# Patient Record
Sex: Male | Born: 1975 | Race: White | Hispanic: No | Marital: Married | Smoking: Never smoker
Health system: Southern US, Community
[De-identification: ages and names within clinical notes are randomized; demographics above are authoritative.]

## PROBLEM LIST (undated history)

## (undated) DIAGNOSIS — I10 Essential (primary) hypertension: Secondary | ICD-10-CM

---

## 2008-07-26 ENCOUNTER — Ambulatory Visit: Payer: Self-pay | Admitting: General Practice

## 2008-07-26 ENCOUNTER — Emergency Department: Payer: Self-pay | Admitting: Emergency Medicine

## 2010-03-13 ENCOUNTER — Ambulatory Visit: Payer: Self-pay | Admitting: General Practice

## 2010-08-23 ENCOUNTER — Emergency Department: Payer: Self-pay | Admitting: Emergency Medicine

## 2011-11-18 ENCOUNTER — Ambulatory Visit: Payer: Self-pay | Admitting: General Practice

## 2012-05-26 ENCOUNTER — Ambulatory Visit: Payer: Self-pay | Admitting: General Practice

## 2013-09-12 DIAGNOSIS — I10 Essential (primary) hypertension: Secondary | ICD-10-CM | POA: Insufficient documentation

## 2015-03-13 ENCOUNTER — Ambulatory Visit: Payer: Self-pay | Admitting: Registered Nurse

## 2015-03-13 VITALS — BP 130/80 | HR 88 | Temp 98.4°F

## 2015-03-13 DIAGNOSIS — J209 Acute bronchitis, unspecified: Secondary | ICD-10-CM

## 2015-03-13 DIAGNOSIS — J0110 Acute frontal sinusitis, unspecified: Secondary | ICD-10-CM

## 2015-03-13 DIAGNOSIS — H6593 Unspecified nonsuppurative otitis media, bilateral: Secondary | ICD-10-CM

## 2015-03-13 MED ORDER — PREDNISONE 20 MG PO TABS: 40.0000 mg | ORAL_TABLET | Freq: Every day | ORAL | Status: AC

## 2015-03-13 MED ORDER — SALINE SPRAY 0.65 % NA SOLN
2.0000 | NASAL | Status: DC | PRN
Start: 2015-03-13 — End: 2015-12-31

## 2015-03-13 MED ORDER — FLUTICASONE PROPIONATE 50 MCG/ACT NA SUSP: 1.0000 | Freq: Two times a day (BID) | NASAL | Status: DC

## 2015-03-13 MED ORDER — AMOXICILLIN-POT CLAVULANATE 875-125 MG PO TABS: 1.0000 | ORAL_TABLET | Freq: Two times a day (BID) | ORAL | Status: AC

## 2015-03-13 MED ORDER — ACETAMINOPHEN 500 MG PO TABS: 1000.0000 mg | ORAL_TABLET | Freq: Four times a day (QID) | ORAL | Status: DC | PRN

## 2015-03-14 NOTE — Progress Notes (Signed)
Subjective:    Patient ID: Frank Tran, male    DOB: Mar 08, 1975, 40 y.o.   MRN: [id number]  HPI Comments: Married caucasian male here for evaluation of clear sputum productive cough, nasal congestion, rhinitis, sore throat has tried dayquil/nyquil without much relief. daughter sick with sinus infection  History of low cardiac ejection fracture recent heart catheterization didn't show blockage in past year.    Cough This is a new problem. The problem has been waxing and waning. The problem occurs hourly. The cough is productive of sputum. Associated symptoms include ear congestion, nasal congestion, postnasal drip, rhinorrhea and a sore throat. Pertinent negatives include no chest pain, chills, ear pain, eye redness, fever, headaches, heartburn, hemoptysis, myalgias, rash, shortness of breath, sweats, weight loss or wheezing. The symptoms are aggravated by lying down. He has tried OTC cough suppressant for the symptoms. The treatment provided mild relief. There is no history of asthma, bronchiectasis, bronchitis, COPD, emphysema, environmental allergies or pneumonia.      Review of Systems  Constitutional: Negative for fever, chills, weight loss, diaphoresis, activity change, appetite change and fatigue.  HENT: Positive for congestion, postnasal drip, rhinorrhea and sore throat. Negative for dental problem, drooling, ear discharge, ear pain, facial swelling, hearing loss, mouth sores, nosebleeds, sinus pressure, sneezing, tinnitus, trouble swallowing and voice change.   Eyes: Negative for photophobia, pain, discharge, redness, itching and visual disturbance.  Respiratory: Positive for cough. Negative for hemoptysis, choking, chest tightness, shortness of breath, wheezing and stridor.   Cardiovascular: Negative for chest pain, palpitations and leg swelling.  Gastrointestinal: Negative for heartburn, nausea, vomiting, abdominal pain, diarrhea, constipation, blood in stool and abdominal  distention.  Endocrine: Negative for cold intolerance and heat intolerance.  Genitourinary: Negative for difficulty urinating.  Musculoskeletal: Negative for myalgias, back pain, joint swelling, arthralgias, gait problem, neck pain and neck stiffness.  Skin: Negative for rash.  Allergic/Immunologic: Negative for environmental allergies.  Neurological: Negative for dizziness, tremors, syncope, facial asymmetry, weakness and headaches.  Hematological: Negative for adenopathy. Does not bruise/bleed easily.  Psychiatric/Behavioral: Negative for sleep disturbance.       Objective:   Physical Exam  Constitutional: He is oriented to person, place, and time. Vital signs are normal. He appears well-developed and well-nourished. He is active and cooperative.  Non-toxic appearance. He does not have a sickly appearance. He appears ill. No distress.  HENT:  Head: Normocephalic and atraumatic.  Right Ear: Hearing, external ear and ear canal normal. A middle ear effusion is present.  Left Ear: Hearing, external ear and ear canal normal. A middle ear effusion is present.  Nose: Mucosal edema and rhinorrhea present. No nose lacerations, sinus tenderness, nasal deformity, septal deviation or nasal septal hematoma. No epistaxis.  No foreign bodies. Right sinus exhibits maxillary sinus tenderness. Right sinus exhibits no frontal sinus tenderness. Left sinus exhibits maxillary sinus tenderness. Left sinus exhibits no frontal sinus tenderness.  Mouth/Throat: Uvula is midline and mucous membranes are normal. Mucous membranes are not pale, not dry and not cyanotic. He does not have dentures. No oral lesions. No trismus in the jaw. Normal dentition. No dental abscesses, uvula swelling, lacerations or dental caries. Posterior oropharyngeal edema and posterior oropharyngeal erythema present. No oropharyngeal exudate or tonsillar abscesses.  Eyes: Conjunctivae, EOM and lids are normal. Pupils are equal, round, and reactive  to light. Right eye exhibits no chemosis, no discharge, no exudate and no hordeolum. No foreign body present in the right eye. Left eye exhibits no chemosis, no discharge, no  exudate and no hordeolum. No foreign body present in the left eye. Right conjunctiva is not injected. Right conjunctiva has no hemorrhage. Left conjunctiva is not injected. Left conjunctiva has no hemorrhage. No scleral icterus. Right eye exhibits normal extraocular motion and no nystagmus. Left eye exhibits normal extraocular motion and no nystagmus. Right pupil is round and reactive. Left pupil is round and reactive. Pupils are equal.  Neck: Trachea normal and normal range of motion. Neck supple. No tracheal tenderness, no spinous process tenderness and no muscular tenderness present. No rigidity. No tracheal deviation, no edema, no erythema and normal range of motion present. No thyroid mass and no thyromegaly present.  Cardiovascular: Normal rate, regular rhythm, S1 normal, S2 normal, normal heart sounds and intact distal pulses.  PMI is not displaced.  Exam reveals no gallop and no friction rub.   No murmur heard. Pulmonary/Chest: Effort normal and breath sounds normal. No stridor. No respiratory distress. He has no decreased breath sounds. He has no wheezes. He has no rhonchi. He has no rales.  Abdominal: Soft. He exhibits no distension.  Musculoskeletal: Normal range of motion. He exhibits no edema or tenderness.  Lymphadenopathy:       Head (right side): No submental, no submandibular, no tonsillar, no preauricular, no posterior auricular and no occipital adenopathy present.       Head (left side): No submental, no submandibular, no tonsillar, no preauricular, no posterior auricular and no occipital adenopathy present.    He has no cervical adenopathy.       Right cervical: No superficial cervical, no deep cervical and no posterior cervical adenopathy present.      Left cervical: No superficial cervical, no deep cervical and  no posterior cervical adenopathy present.  Neurological: He is alert and oriented to person, place, and time. He displays no atrophy and no tremor. No cranial nerve deficit or sensory deficit. He exhibits normal muscle tone. He displays no seizure activity. Coordination and gait normal. GCS eye subscore is 4. GCS verbal subscore is 5. GCS motor subscore is 6.  Skin: Skin is warm, dry and intact. No abrasion, no bruising, no burn, no ecchymosis, no laceration, no lesion, no petechiae and no rash noted. He is not diaphoretic. No cyanosis or erythema. No pallor. Nails show no clubbing.  Psychiatric: He has a normal mood and affect. His speech is normal and behavior is normal. Judgment and thought content normal. Cognition and memory are normal.  Nursing note and vitals reviewed.         Assessment & Plan:  A-sinusitis frontal acute, bronchitis acute, otitis media with effusion bilaterally P- flonase 1 spray each nostril BID, saline 2 sprays each nostril q2h prn congestion.  Tylenol 1000mg  po QID prn pain/fever.  If no improvement with 48 hours of saline and flonase use start augmentin 875mg  po BID x 10 days.  Rx given.  No evidence of systemic bacterial infection, non toxic and well hydrated.  I do not see where any further testing or imaging is necessary at this time.   I will suggest supportive care, rest, good hygiene and encourage the patient to take adequate fluids.  The patient is to return to clinic or EMERGENCY ROOM if symptoms worsen or change significantly.  Patient verbalized agreement and understanding of treatment plan and had no further questions at this time.   P2:  Hand washing and cover cough  otc cough suppresant of choice po prn.  Prednisone 40mg  po daily with breakfast start in 48  hours if no improvement with flonase x 48 hours.  Bronchitis simple, community acquired, may have started as viral (probably respiratory syncytial, parainfluenza, influenza, or adenovirus), but now evidence  of acute purulent bronchitis with resultant bronchial edema and mucus formation.  Viruses are the most common cause of bronchial inflammation in otherwise healthy adults with acute bronchitis.  The appearance of sputum is not predictive of whether a bacterial infection is present.  Purulent sputum is most often caused by viral infections.  There are a small portion of those caused by non-viral agents being Mycoplamsa pneumonia.  Microscopic examination or C&S of sputum in the healthy adult with acute bronchitis is generally not helpful (usually negative or normal respiratory flora) other considerations being cough from upper respiratory tract infections, sinusitis or allergic syndromes (mild asthma or viral pneumonia).  Differential Diagnosis:  reactive airway disease (asthma, allergic aspergillosis (eosinophilia), chronic bronchitis, respiratory infection (Sinusitis, Common cold, pneumonia), congestive heart failure, reflux esophagitis, bronchogenic tumor, aspiration syndromes and/or exposure irritants/tobacco smoke.  In this case, there is no evidence of any invasive bacterial illness.  Most likely viral etiology so will hold on antibiotic treatment.  Advise supportive care with rest, encourage fluids, good hygiene and watch for any worsening symptoms.  If they were to develop:  come back to the office or go to the emergency room if after hours. Without high fever, severe dyspnea, lack of physical findings or other risk factors, I will hold on a chest radiograph and CBC at this time.  I discussed that approximately 50% of patients with acute bronchitis have a cough that lasts up to three weeks, and 25% for over a month.  Tylenol, one to two tablets every four hours as needed for fever or myalgias.   No aspirin.  Patient instructed to follow up in one week or sooner if symptoms worsen. Patient verbalized agreement and understanding of treatment plan.  P2:  hand washing and cover cough  Supportive treatment.   No  evidence of invasive bacterial infection, non toxic and well hydrated.  This is most likely self limiting viral infection.  I do not see where any further testing or imaging is necessary at this time.   I will suggest supportive care, rest, good hygiene and encourage the patient to take adequate fluids.  The patient is to return to clinic or EMERGENCY ROOM if symptoms worsen or change significantly e.g. ear pain, fever, purulent discharge from ears or bleeding. Patient verbalized agreement and understanding of treatment plan.

## 2015-12-31 ENCOUNTER — Encounter: Payer: Self-pay | Admitting: Physician Assistant

## 2015-12-31 ENCOUNTER — Ambulatory Visit: Payer: Self-pay | Admitting: Physician Assistant

## 2015-12-31 VITALS — BP 115/80 | HR 74 | Temp 98.5°F | Ht 72.0 in | Wt 253.0 lb

## 2015-12-31 DIAGNOSIS — Z0000 Encounter for general adult medical examination without abnormal findings: Secondary | ICD-10-CM

## 2015-12-31 DIAGNOSIS — M722 Plantar fascial fibromatosis: Secondary | ICD-10-CM

## 2015-12-31 DIAGNOSIS — M545 Low back pain, unspecified: Secondary | ICD-10-CM

## 2015-12-31 DIAGNOSIS — K219 Gastro-esophageal reflux disease without esophagitis: Secondary | ICD-10-CM

## 2015-12-31 MED ORDER — MELOXICAM 15 MG PO TABS: 15.0000 mg | ORAL_TABLET | Freq: Every day | ORAL | 3 refills | Status: DC

## 2015-12-31 MED ORDER — ESOMEPRAZOLE MAGNESIUM 40 MG PO CPDR: 40.0000 mg | DELAYED_RELEASE_CAPSULE | Freq: Every day | ORAL | 6 refills | Status: DC

## 2015-12-31 NOTE — Progress Notes (Signed)
S: c/o left heel pain only in the mornings or after sitting for awhile,  low back pain bc has been walking differently due to foot pain, needs rx for nexium as has been buying otc and its expensive, denies fever/chills/cp/sob/abd pain or blood in stool, hx htn, on meds, nonsmoker  O: vitals wnl, nad, ENT wnl, neck supple no lymph, lungs c ta , cv rrr, abd soft nontender bs normal, plantar tendon wnl, lumbar spine nontender, neg slr, n/v intact  A: htn, plantar fascitis, gerd, low back pain  P: mobic, nexium , labs done today for biometric form, f/u with chiropractor

## 2016-01-01 ENCOUNTER — Ambulatory Visit: Payer: Self-pay | Admitting: Physician Assistant

## 2016-01-01 LAB — VITAMIN D 25 HYDROXY (VIT D DEFICIENCY, FRACTURES): Vit D, 25-Hydroxy: 28.1 ng/mL — ABNORMAL LOW (ref 30.0–100.0)

## 2016-01-01 LAB — CMP12+LP+TP+TSH+6AC+PSA+CBC…
A/G RATIO: 1.4 (ref 1.2–2.2)
ALK PHOS: 91 IU/L (ref 39–117)
ALT: 43 IU/L (ref 0–44)
AST: 25 IU/L (ref 0–40)
Albumin: 4.3 g/dL (ref 3.5–5.5)
BASOS ABS: 0.1 10*3/uL (ref 0.0–0.2)
BASOS: 1 %
BILIRUBIN TOTAL: 0.5 mg/dL (ref 0.0–1.2)
BUN / CREAT RATIO: 11 (ref 9–20)
BUN: 12 mg/dL (ref 6–24)
CALCIUM: 9.2 mg/dL (ref 8.7–10.2)
CHOL/HDL RATIO: 4.2 ratio (ref 0.0–5.0)
Chloride: 102 mmol/L (ref 96–106)
Cholesterol, Total: 142 mg/dL (ref 100–199)
Creatinine, Ser: 1.09 mg/dL (ref 0.76–1.27)
EOS (ABSOLUTE): 0.1 10*3/uL (ref 0.0–0.4)
EOS: 2 %
Estimated CHD Risk: 0.8 times avg. (ref 0.0–1.0)
Free Thyroxine Index: 1.3 (ref 1.2–4.9)
GFR calc Af Amer: 96 mL/min/{1.73_m2} (ref >59)
GFR calc non Af Amer: 83 mL/min/{1.73_m2} (ref >59)
GGT: 49 IU/L (ref 0–65)
GLOBULIN, TOTAL: 3.1 g/dL (ref 1.5–4.5)
Glucose: 116 mg/dL — ABNORMAL HIGH (ref 65–99)
HDL: 34 mg/dL — AB (ref >39)
HEMATOCRIT: 43 % (ref 37.5–51.0)
HEMOGLOBIN: 14.4 g/dL (ref 13.0–17.7)
IMMATURE GRANS (ABS): 0 10*3/uL (ref 0.0–0.1)
IMMATURE GRANULOCYTES: 0 %
Iron: 134 ug/dL (ref 38–169)
LDH: 180 IU/L (ref 121–224)
LDL CALC: 90 mg/dL (ref 0–99)
LYMPHS: 36 %
Lymphocytes Absolute: 2.3 10*3/uL (ref 0.7–3.1)
MCH: 30.4 pg (ref 26.6–33.0)
MCHC: 33.5 g/dL (ref 31.5–35.7)
MCV: 91 fL (ref 79–97)
MONOS ABS: 0.5 10*3/uL (ref 0.1–0.9)
Monocytes: 8 %
NEUTROS PCT: 53 %
Neutrophils Absolute: 3.4 10*3/uL (ref 1.4–7.0)
PLATELETS: 165 10*3/uL (ref 150–379)
PROSTATE SPECIFIC AG, SERUM: 0.9 ng/mL (ref 0.0–4.0)
Phosphorus: 3.8 mg/dL (ref 2.5–4.5)
Potassium: 4.1 mmol/L (ref 3.5–5.2)
RBC: 4.74 x10E6/uL (ref 4.14–5.80)
RDW: 12.8 % (ref 12.3–15.4)
SODIUM: 141 mmol/L (ref 134–144)
T3 Uptake Ratio: 22 % — ABNORMAL LOW (ref 24–39)
T4, Total: 6.1 ug/dL (ref 4.5–12.0)
TRIGLYCERIDES: 92 mg/dL (ref 0–149)
TSH: 1.18 u[IU]/mL (ref 0.450–4.500)
Total Protein: 7.4 g/dL (ref 6.0–8.5)
Uric Acid: 6.2 mg/dL (ref 3.7–8.6)
VLDL CHOLESTEROL CAL: 18 mg/dL (ref 5–40)
WBC: 6.3 10*3/uL (ref 3.4–10.8)

## 2016-01-25 ENCOUNTER — Encounter: Payer: Self-pay | Admitting: Physician Assistant

## 2016-01-25 ENCOUNTER — Ambulatory Visit: Payer: Self-pay | Admitting: Physician Assistant

## 2016-01-25 VITALS — BP 130/85 | HR 99 | Temp 98.5°F

## 2016-01-25 DIAGNOSIS — M7051 Other bursitis of knee, right knee: Secondary | ICD-10-CM

## 2016-01-25 NOTE — Progress Notes (Signed)
S: c/o r knee pain, no known injury, area has been swollen, painful to extend, no numbness or tingling, did chop some wood the other day and was kneeling a lot, using mobic, ace wrap  O: vitals wnl, nad, r knee with swelling when compared to left, suprapatellar bursa is swollen and tender, decreased rom with extension due to discomfort, no bony tenderness, skin is wnl, no calf tenderness, n/v intact  A: acute bursitis of r knee  P: ace wrap, ice, continue mobic, elevate and rest

## 2016-04-07 ENCOUNTER — Ambulatory Visit: Payer: Self-pay | Admitting: Physician Assistant

## 2016-04-07 ENCOUNTER — Encounter: Payer: Self-pay | Admitting: Physician Assistant

## 2016-04-07 VITALS — BP 140/90 | HR 74 | Temp 97.1°F

## 2016-04-07 DIAGNOSIS — M25561 Pain in right knee: Secondary | ICD-10-CM

## 2016-04-07 NOTE — Progress Notes (Signed)
Patient has been referred to see Dr. Hosie SpangleKrezinski at [facility] for 04/09/2016 @ 9am.  Patient has been notified and has accepted the appointment.

## 2016-04-07 NOTE — Progress Notes (Signed)
S: c/o r knee pain, had bursitis and swelling a while back, now gets sharp pains on and off on medial aspect, will feel like the leg is stuck and he can't extend, will have to do it slowly, no known injury, no numbness or tingling  O: vitals wnl, nad, r knee tender at medial joint line and above, full rom, acl feels a little lax but is intact, n/v intact, knee brace applied  A: acute knee pain? Meniscus tear  P: will refer to ortho

## 2016-05-27 ENCOUNTER — Other Ambulatory Visit: Payer: Self-pay | Admitting: Physician Assistant

## 2016-05-27 NOTE — Telephone Encounter (Signed)
Med refill for mobic approved 

## 2016-07-03 ENCOUNTER — Encounter: Payer: Self-pay | Admitting: Physician Assistant

## 2016-07-03 ENCOUNTER — Ambulatory Visit: Payer: Self-pay | Admitting: Physician Assistant

## 2016-07-03 VITALS — BP 120/70 | HR 89 | Temp 98.5°F | Resp 16

## 2016-07-03 DIAGNOSIS — J069 Acute upper respiratory infection, unspecified: Secondary | ICD-10-CM

## 2016-07-03 MED ORDER — AMOXICILLIN 875 MG PO TABS: 875.0000 mg | ORAL_TABLET | Freq: Two times a day (BID) | ORAL | 0 refills | Status: DC

## 2016-07-03 NOTE — Progress Notes (Signed)
S: C/o sore throat, cough, and  congestion for 2 days, no fever, chills, cp/sob, v/d; is really fatigued, just doesn't feel well Using otc meds:   O: PE: vitals wnl, nad, perrl eomi, normocephalic, tm on r was dull with pus and redness; l tm is  dull, nasal mucosa red and swollen on left throat injected, neck supple no lymph, lungs c t a, cv rrr, neuro intact  A:  Acute sinusitis, aom   P: drink fluids, continue regular meds , use otc meds of choice, return if not improving in 5 days, return earlier if worsening , amoxil 875mg  bid

## 2017-02-19 ENCOUNTER — Telehealth: Payer: Self-pay

## 2017-02-19 NOTE — Telephone Encounter (Signed)
Pt asked for Nexium refill. Was going to return call to Pt to let them know provider requested to establish PCP first, but Monique took refill request to handle.

## 2017-02-20 ENCOUNTER — Other Ambulatory Visit: Payer: Self-pay | Admitting: Emergency Medicine

## 2017-02-20 MED ORDER — ESOMEPRAZOLE MAGNESIUM 40 MG PO CPDR: 40.0000 mg | DELAYED_RELEASE_CAPSULE | Freq: Every day | ORAL | 6 refills | Status: AC

## 2017-06-05 ENCOUNTER — Ambulatory Visit: Payer: Self-pay | Admitting: Family Medicine

## 2017-06-05 VITALS — BP 159/92 | HR 72 | Temp 98.4°F | Resp 17 | Ht 72.0 in | Wt 260.0 lb

## 2017-06-05 DIAGNOSIS — Z008 Encounter for other general examination: Secondary | ICD-10-CM

## 2017-06-05 DIAGNOSIS — Z0189 Encounter for other specified special examinations: Principal | ICD-10-CM

## 2017-06-05 LAB — GLUCOSE, POCT (MANUAL RESULT ENTRY): POC GLUCOSE: 107 mg/dL — AB (ref 70–99)

## 2017-06-05 NOTE — Progress Notes (Signed)
Subjective: Annual biometrics screening  Patient presents for his annual biometric screening. Patient denies eating a healthy, well-rounded diet but reports that he is active and gets regular exercise. PCP: None currently. 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 Patient denies any other issues or concerns.   Review of Systems Unremarkable  Objective  Physical Exam General: Awake, alert and oriented. No acute distress. Well developed, hydrated and nourished. Appears stated age.  HEENT: Supple neck without adenopathy. Sclera is non-icteric. The ear canal is clear without discharge. The tympanic membrane is normal in appearance with normal landmarks and cone of light. Nasal mucosa is pink and moist. Oral mucosa is pink and moist. The pharynx is normal in appearance without tonsillar swelling or exudates.  Skin: Skin in warm, dry and intact without rashes or lesions. Appropriate color for ethnicity. Cardiac: Heart rate and rhythm are normal. No murmurs, gallops, or rubs are auscultated.  Respiratory: The chest wall is symmetric and without deformity. No signs of respiratory distress. Lung sounds are clear in all lobes bilaterally without rales, ronchi, or wheezes.  Neurological: The patient is awake, alert and oriented to person, place, and time with normal speech.  Memory is normal and thought processes intact. No gait abnormalities are appreciated.  Psychiatric: Appropriate mood and affect.   Assessment Annual biometrics screening  Plan  Lipid panel pending. Encouraged routine visits with primary care provider.  Provided patient with resources and encouraged him to establish care with a new primary care provider within the next month. Fasting blood sugar is 107 today.  Discussed normal values and impaired fasting glucose.  Advised patient to follow-up with his primary care provider regarding this for further evaluation.  Discussed lifestyle modifications.  Patient denies any  history of impaired fasting glucose or diabetes. Blood pressure is 159/92 today.  Patient does not regularly check this at home.  Advised patient to check his blood pressure at least once a day and report abnormal values to his primary care provider.  Advised patient to keep a log of his blood pressures.  Discussed normal values. Encouraged patient to get regular exercise and eat a healthy, well-rounded diet.

## 2017-06-05 NOTE — Progress Notes (Signed)
Waist- 40 in.

## 2017-06-06 LAB — LIPID PANEL
Chol/HDL Ratio: 4.7 ratio (ref 0.0–5.0)
Cholesterol, Total: 151 mg/dL (ref 100–199)
HDL: 32 mg/dL — AB (ref >39)
LDL Calculated: 103 mg/dL — ABNORMAL HIGH (ref 0–99)
Triglycerides: 79 mg/dL (ref 0–149)
VLDL Cholesterol Cal: 16 mg/dL (ref 5–40)

## 2017-06-09 NOTE — Progress Notes (Signed)
Frank Tran, Will you call the patient and inform them that their lipid panel came back?  Everything is normal, with the exception of his HDL cholesterol and LDL cholesterol. The HDL cholesterol ("good cholesterol") is decreased at 32, normal values are above 39.  The LDL cholesterol ("bad cholesterol") is elevated at 103, normal values are below 99. Please advise the patient to follow-up with their primary care provider regarding these results.

## 2017-07-21 ENCOUNTER — Emergency Department: Payer: Managed Care, Other (non HMO)

## 2017-07-21 ENCOUNTER — Emergency Department
Admission: EM | Admit: 2017-07-21 | Discharge: 2017-07-21 | Disposition: A | Discharge status: Home / Self Care | Payer: Managed Care, Other (non HMO) | Attending: Emergency Medicine | Admitting: Emergency Medicine

## 2017-07-21 ENCOUNTER — Encounter: Payer: Self-pay | Admitting: Emergency Medicine

## 2017-07-21 ENCOUNTER — Other Ambulatory Visit: Payer: Self-pay

## 2017-07-21 DIAGNOSIS — W01198A Fall on same level from slipping, tripping and stumbling with subsequent striking against other object, initial encounter: Secondary | ICD-10-CM | POA: Insufficient documentation

## 2017-07-21 DIAGNOSIS — Y929 Unspecified place or not applicable: Secondary | ICD-10-CM | POA: Insufficient documentation

## 2017-07-21 DIAGNOSIS — Z79899 Other long term (current) drug therapy: Secondary | ICD-10-CM | POA: Diagnosis not present

## 2017-07-21 DIAGNOSIS — Y9389 Activity, other specified: Secondary | ICD-10-CM | POA: Insufficient documentation

## 2017-07-21 DIAGNOSIS — M25521 Pain in right elbow: Secondary | ICD-10-CM | POA: Diagnosis not present

## 2017-07-21 DIAGNOSIS — Y998 Other external cause status: Secondary | ICD-10-CM | POA: Insufficient documentation

## 2017-07-21 DIAGNOSIS — I10 Essential (primary) hypertension: Secondary | ICD-10-CM | POA: Insufficient documentation

## 2017-07-21 DIAGNOSIS — S62101A Fracture of unspecified carpal bone, right wrist, initial encounter for closed fracture: Secondary | ICD-10-CM | POA: Diagnosis not present

## 2017-07-21 DIAGNOSIS — S6991XA Unspecified injury of right wrist, hand and finger(s), initial encounter: Secondary | ICD-10-CM | POA: Diagnosis present

## 2017-07-21 HISTORY — DX: Essential (primary) hypertension: I10

## 2017-07-21 MED ORDER — TRAMADOL HCL 50 MG PO TABS: 50.0000 mg | ORAL_TABLET | Freq: Two times a day (BID) | ORAL | 0 refills | Status: DC | PRN

## 2017-07-21 MED ORDER — NAPROXEN 500 MG PO TABS: 500.0000 mg | ORAL_TABLET | Freq: Two times a day (BID) | ORAL | Status: AC

## 2017-07-21 NOTE — Discharge Instructions (Addendum)
Wear splint until evaluation by orthopedics. °

## 2017-07-21 NOTE — ED Provider Notes (Signed)
[facility] Emergency Department Provider Note   ____________________________________________   First MD Initiated Contact with Patient 07/21/17 1402     (approximate)  I have reviewed the triage vital signs and the nursing notes.   HISTORY  Chief Complaint Wrist Injury and Elbow Pain    HPI Frank Tran is a 42 y.o. male patient complain of right wrist and elbow pain secondary to a trip and fall prior to arrival.  Patient he broke his fall with the right upper extremity.  Patient denies loss of sensation no loss of movement.  Patient denies LOC or head injury.  Patient is right-hand dominant.  Patient rates pain as a 5/10.  Patient described pain is "aching".  Patient placed in a wrist splint prior to arrival.  Past Medical History:  Diagnosis Date  . Hypertension     Patient Active Problem List   Diagnosis Date Noted  . BP (high blood pressure) 09/12/2013      Prior to Admission medications   Medication Sig Start Date End Date Taking? Authorizing Provider  esomeprazole (NEXIUM) 40 MG capsule Take 1 capsule (40 mg total) by mouth daily at 12 noon. 02/20/17   Joni ReiningSmith, Ronald K, PA-C  metoprolol succinate (TOPROL-XL) 50 MG 24 hr tablet  09/29/14   [provider]  naproxen (NAPROSYN) 500 MG tablet Take 1 tablet (500 mg total) by mouth 2 (two) times daily with a meal. 07/21/17   Joni ReiningSmith, Ronald K, PA-C  traMADol (ULTRAM) 50 MG tablet Take 1 tablet (50 mg total) by mouth every 12 (twelve) hours as needed. 07/21/17   Joni ReiningSmith, Ronald K, PA-C    Allergies Patient has no known allergies.  No family history on file.  Social History Social History   Tobacco Use  . Smoking status: Never Smoker  . Smokeless tobacco: Never Used  Substance Use Topics  . Alcohol use: Not Currently  . Drug use: Not on file    Review of Systems  Constitutional: No fever/chills Eyes: No visual changes. ENT: No sore throat. Cardiovascular: Denies chest  pain. Respiratory: Denies shortness of breath. Gastrointestinal: No abdominal pain.  No nausea, no vomiting.  No diarrhea.  No constipation. Genitourinary: Negative for dysuria. Musculoskeletal: Right wrist and elbow pain. Skin: Negative for rash. Neurological: Negative for headaches, focal weakness or numbness. Endocrine:Hypertension. ____________________________________________   PHYSICAL EXAM:  VITAL SIGNS: ED Triage Vitals [07/21/17 1353]  Enc Vitals Group     BP (!) 149/91     Pulse Rate 81     Resp 18     Temp 98.6 F (37 C)     Temp Source Oral     SpO2 95 %     Weight 250 lb (113.4 kg)     Height 6' (1.829 m)     Head Circumference      Peak Flow      Pain Score      Pain Loc      Pain Edu?      Excl. in GC?     Constitutional: Alert and oriented. Well appearing and in no acute distress. Cardiovascular: Normal rate, regular rhythm. Grossly normal heart sounds.  Good peripheral circulation. Respiratory: Normal respiratory effort.  No retractions. Lungs CTAB. Musculoskeletal: No obvious deformity to the right wrist or elbow.  Patient has moderate edema to the right wrist. Neurologic:  Normal speech and language. No gross focal neurologic deficits are appreciated. No gait instability. Skin:  Skin is warm, dry and intact. No  rash noted. Psychiatric: Mood and affect are normal. Speech and behavior are normal.  ____________________________________________   LABS (all labs ordered are listed, but only abnormal results are displayed)  Labs Reviewed - No data to display ____________________________________________  EKG   ____________________________________________  RADIOLOGY    Official radiology report(s): Dg Elbow Complete Right  Result Date: 07/21/2017 CLINICAL DATA:  Right elbow pain after fall. EXAM: RIGHT ELBOW - COMPLETE 3+ VIEW COMPARISON:  None. FINDINGS: There is no evidence of fracture, dislocation, or joint effusion. There is no evidence of  arthropathy or other focal bone abnormality. Soft tissues are unremarkable. IMPRESSION: Negative. Electronically Signed   By: Obie Dredge M.D.   On: 07/21/2017 14:30   Dg Wrist Complete Right  Result Date: 07/21/2017 CLINICAL DATA:  Wrist pain after fall. EXAM: RIGHT WRIST - COMPLETE 3+ VIEW COMPARISON:  Right hand x-rays dated July 26, 2008. FINDINGS: Nondisplaced, intra-articular fracture of the distal radius. No additional fracture. No dislocation. Joint spaces are preserved. Bone mineralization is normal. Soft tissue swelling about the wrist. IMPRESSION: Nondisplaced, intra-articular fracture of the distal radius. Electronically Signed   By: Obie Dredge M.D.   On: 07/21/2017 14:29    ____________________________________________   PROCEDURES  Procedure(s) performed: None  Procedures  Critical Care performed: No  ____________________________________________   INITIAL IMPRESSION / ASSESSMENT AND PLAN / ED COURSE  As part of my medical decision making, I reviewed the following data within the electronic MEDICAL RECORD NUMBER    Right wrist pain secondary to distal radial fracture.  Right elbow pain secondary to contusion.  Discussed x-ray findings with patient.  Patient placed in a volar splint.  Patient advised to follow orthopedic for definitive evaluation and treatment.      ____________________________________________   FINAL CLINICAL IMPRESSION(S) / ED DIAGNOSES  Final diagnoses:  Right wrist fracture, closed, initial encounter     ED Discharge Orders        Ordered    naproxen (NAPROSYN) 500 MG tablet  2 times daily with meals     07/21/17 1454    traMADol (ULTRAM) 50 MG tablet  Every 12 hours PRN     07/21/17 1454       Note:  This document was prepared using Dragon voice recognition software and may include unintentional dictation errors.    Joni Reining, PA-C 07/21/17 1506    Jene Every, MD 07/21/17 [phone]

## 2017-07-21 NOTE — ED Triage Notes (Signed)
Pt states he was in his shop, was backing up and tripped over something, ended up catching himself with his right wrist, appears slightly swollen, able to move fingers and hand, however, decreased mobility. Denies hitting his head or LOC. C/o right elbow pain as well.

## 2017-07-21 NOTE — ED Notes (Signed)
Sitting at bedside, appears in NAD.

## 2017-11-26 ENCOUNTER — Ambulatory Visit: Payer: Self-pay | Admitting: Emergency Medicine

## 2017-11-26 VITALS — BP 142/88 | HR 90 | Temp 98.5°F | Resp 14 | Ht 72.0 in | Wt 250.0 lb

## 2017-11-26 DIAGNOSIS — J209 Acute bronchitis, unspecified: Secondary | ICD-10-CM

## 2017-11-26 DIAGNOSIS — R05 Cough: Secondary | ICD-10-CM

## 2017-11-26 DIAGNOSIS — R059 Cough, unspecified: Secondary | ICD-10-CM

## 2017-11-26 MED ORDER — DOXYCYCLINE HYCLATE 100 MG PO TABS: 100.0000 mg | ORAL_TABLET | Freq: Two times a day (BID) | ORAL | 0 refills | Status: AC

## 2017-11-26 MED ORDER — BENZONATATE 100 MG PO CAPS: 100.0000 mg | ORAL_CAPSULE | Freq: Three times a day (TID) | ORAL | 0 refills | Status: AC | PRN

## 2017-11-26 NOTE — Patient Instructions (Addendum)
Take antibiotics as instructed. Drink good quantities of fluids. You have capsules to take for cough. Follow-up with your family doctor if not improving. Return to clinic in 2 weeks for flu shot    Cough, Adult Coughing is a reflex that clears your throat and your airways. Coughing helps to heal and protect your lungs. It is normal to cough occasionally, but a cough that happens with other symptoms or lasts a long time may be a sign of a condition that needs treatment. A cough may last only 2-3 weeks (acute), or it may last longer than 8 weeks (chronic). What are the causes? Coughing is commonly caused by:  Breathing in substances that irritate your lungs.  A viral or bacterial respiratory infection.  Allergies.  Asthma.  Postnasal drip.  Smoking.  Acid backing up from the stomach into the esophagus (gastroesophageal reflux).  Certain medicines.  Chronic lung problems, including COPD (or rarely, lung cancer).  Other medical conditions such as heart failure.  Follow these instructions at home: Pay attention to any changes in your symptoms. Take these actions to help with your discomfort:  Take medicines only as told by your health care provider. ? If you were prescribed an antibiotic medicine, take it as told by your health care provider. Do not stop taking the antibiotic even if you start to feel better. ? Talk with your health care provider before you take a cough suppressant medicine.  Drink enough fluid to keep your urine clear or pale yellow.  If the air is dry, use a cold steam vaporizer or humidifier in your bedroom or your home to help loosen secretions.  Avoid anything that causes you to cough at work or at home.  If your cough is worse at night, try sleeping in a semi-upright position.  Avoid cigarette smoke. If you smoke, quit smoking. If you need help quitting, ask your health care provider.  Avoid caffeine.  Avoid alcohol.  Rest as needed.  Contact  a health care provider if:  You have new symptoms.  You cough up pus.  Your cough does not get better after 2-3 weeks, or your cough gets worse.  You cannot control your cough with suppressant medicines and you are losing sleep.  You develop pain that is getting worse or pain that is not controlled with pain medicines.  You have a fever.  You have unexplained weight loss.  You have night sweats. Get help right away if:  You cough up blood.  You have difficulty breathing.  Your heartbeat is very fast. This information is not intended to replace advice given to you by your health care provider. Make sure you discuss any questions you have with your health care provider. Document Released: 06/28/2010 Document Revised: 06/07/2015 Document Reviewed: 03/08/2014 Elsevier Interactive Patient Education  [employer]2018 [employer]

## 2017-11-26 NOTE — Progress Notes (Signed)
Subjective. Patient states he was sick about a month ago.  He had a head cold, sore throat, and congestion.  This lasted about 10 days.  He seemed to feel well for about a week and then developed a recurrent cough postnasal drip.  He is non-smoker.  He does not smoke or vape.  His cough has been minimally productive.  He has not had any purulent nasal drainage. Objective. Alert cooperative not in any distress. Neck supple without adenopathy. Throat without redness. Ears normal. Chest exam reveals bilateral rhonchi without wheezes. Heart regular rate and rhythm. Assessment Patient was ill a month ago and seemed to resolve.  He now has recurrent symptoms of head congestion and cough.  His cough is productive at times.  Cough is always worse at night. Plan. Doxycycline 100 twice daily for 10 days.Kimberlee Nearing. Tessalon Perles. Follow-up with primary care physician.

## 2017-12-02 NOTE — Addendum Note (Signed)
Addended by: Karen KaysWARD, Vale Peraza on: 12/02/2017 03:49 PM   Modules accepted: Level of Service

## 2020-05-20 IMAGING — DX DG WRIST COMPLETE 3+V*R*
4 series · 4 of 4 positions shown · non-contrast
Comparison: Right hand x-rays dated July 26, 2008.

CLINICAL DATA: Wrist pain after fall.

EXAM:
RIGHT WRIST - COMPLETE 3+ VIEW

[wrist ap (1 of 2)]
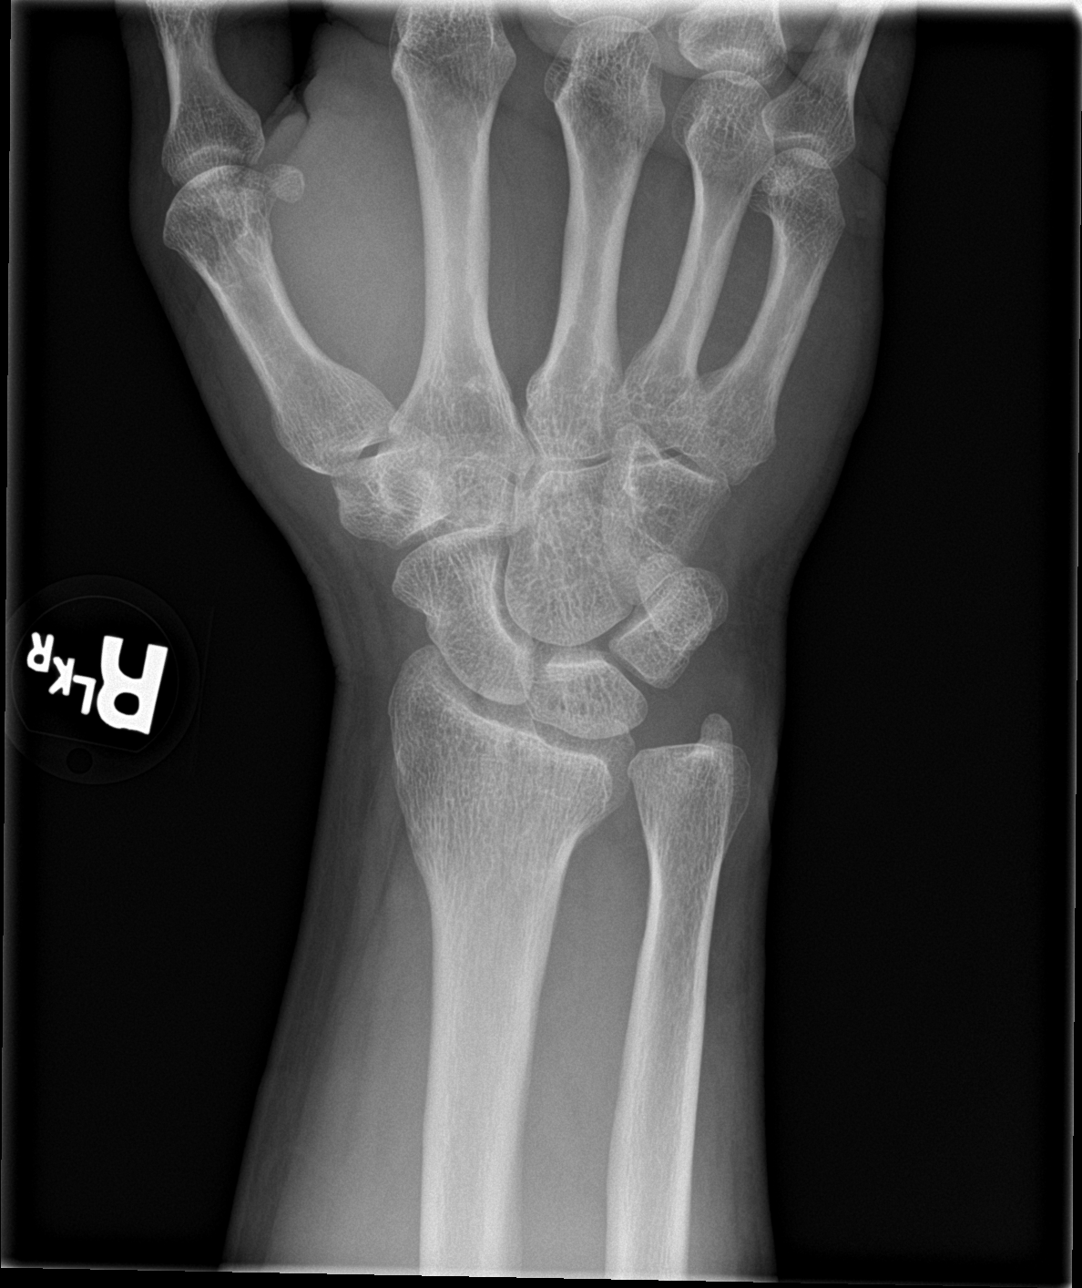

[wrist obl]
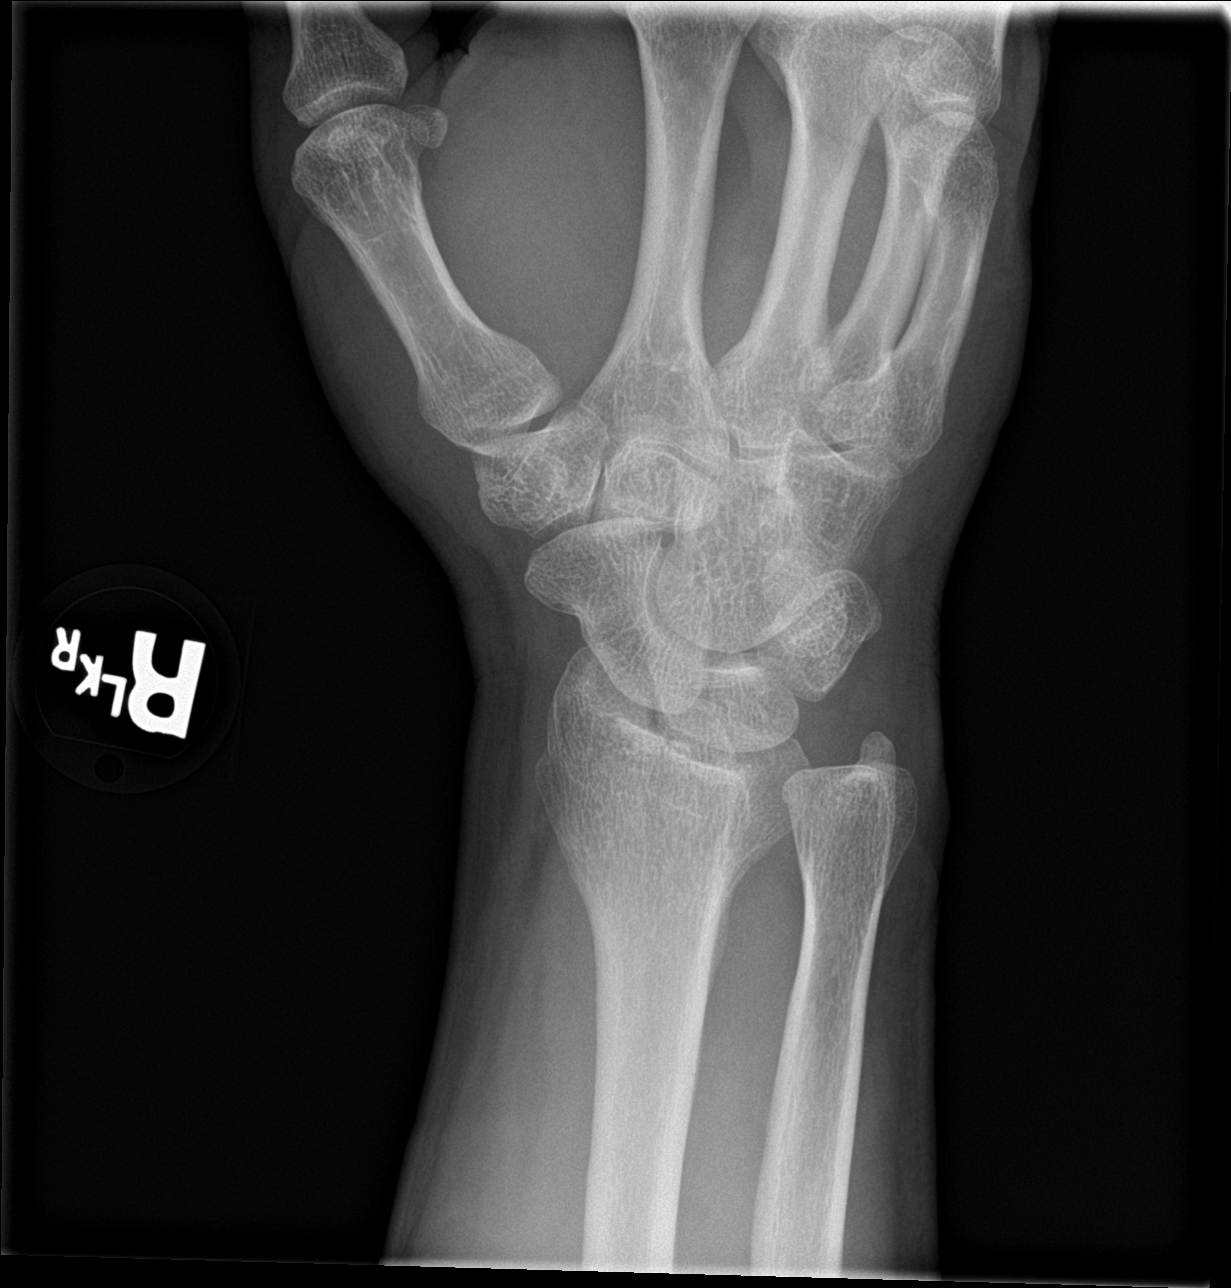

[wrist lat]
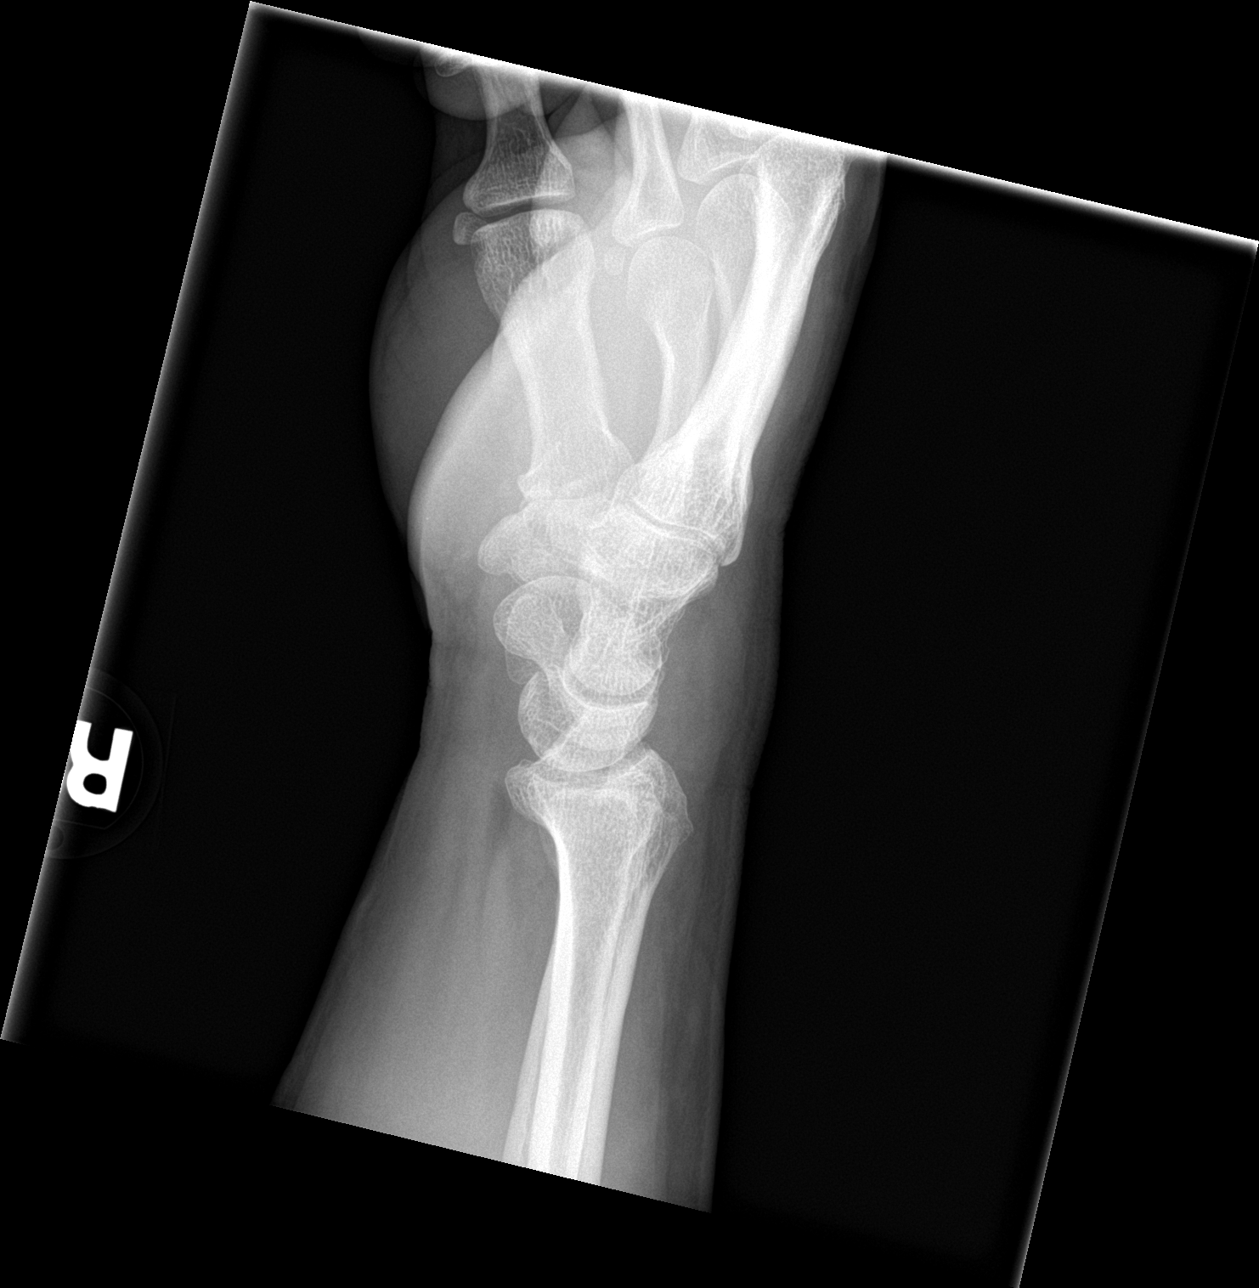

[wrist ap (2 of 2)]
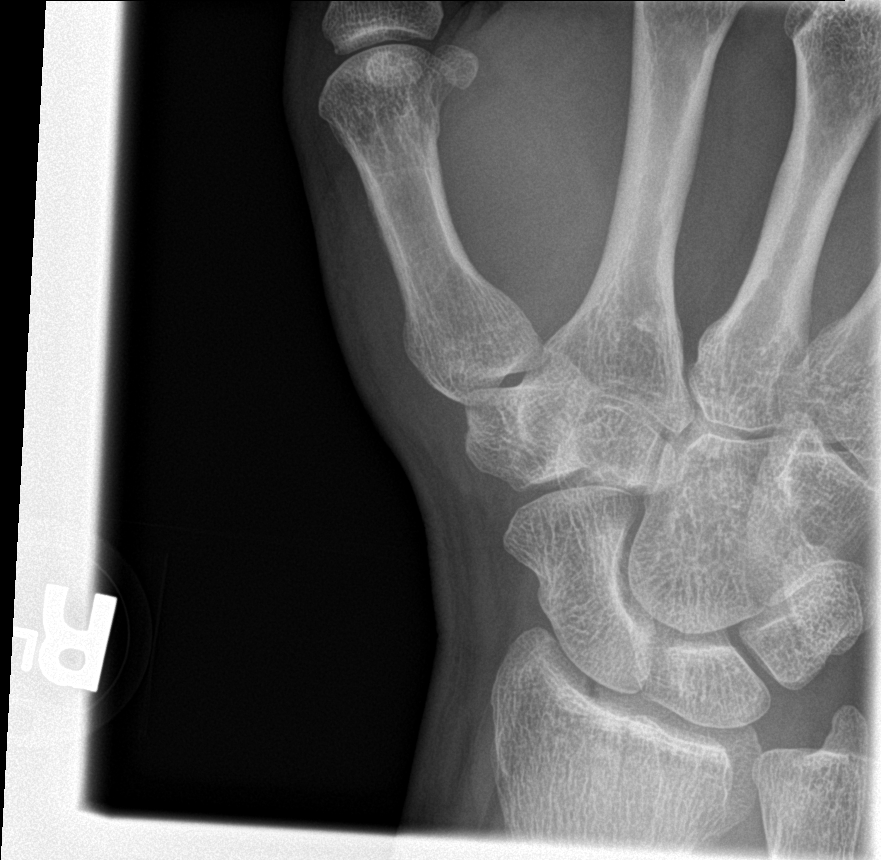

[4 of 4 positions shown; findings below may reference images not displayed]

FINDINGS: Nondisplaced, intra-articular fracture of the distal radius. No
additional fracture. No dislocation. Joint spaces are preserved.
Bone mineralization is normal. Soft tissue swelling about the wrist.
IMPRESSION: Nondisplaced, intra-articular fracture of the distal radius.

## 2020-06-09 ENCOUNTER — Ambulatory Visit
Admission: EM | Admit: 2020-06-09 | Discharge: 2020-06-09 | Disposition: A | Discharge status: Home / Self Care | Payer: Managed Care, Other (non HMO) | Attending: Sports Medicine | Admitting: Sports Medicine

## 2020-06-09 ENCOUNTER — Encounter: Payer: Self-pay | Admitting: Emergency Medicine

## 2020-06-09 ENCOUNTER — Other Ambulatory Visit: Payer: Self-pay

## 2020-06-09 DIAGNOSIS — R82998 Other abnormal findings in urine: Secondary | ICD-10-CM

## 2020-06-09 DIAGNOSIS — R319 Hematuria, unspecified: Secondary | ICD-10-CM | POA: Diagnosis not present

## 2020-06-09 DIAGNOSIS — H5789 Other specified disorders of eye and adnexa: Secondary | ICD-10-CM | POA: Diagnosis not present

## 2020-06-09 DIAGNOSIS — R300 Dysuria: Secondary | ICD-10-CM

## 2020-06-09 DIAGNOSIS — H1033 Unspecified acute conjunctivitis, bilateral: Secondary | ICD-10-CM

## 2020-06-09 LAB — URINALYSIS, COMPLETE (UACMP) WITH MICROSCOPIC
Bilirubin Urine: NEGATIVE
Glucose, UA: NEGATIVE mg/dL
Leukocytes,Ua: NEGATIVE
Nitrite: NEGATIVE
Protein, ur: 30 mg/dL — AB
Specific Gravity, Urine: >1.03 — ABNORMAL HIGH (ref 1.005–1.030)
pH: 5.5 (ref 5.0–8.0)

## 2020-06-09 MED ORDER — ERYTHROMYCIN 5 MG/GM OP OINT: TOPICAL_OINTMENT | OPHTHALMIC | 0 refills | Status: AC

## 2020-06-09 NOTE — ED Provider Notes (Signed)
[facility]    CSN: [id number] Arrival date & time: 06/09/20  1505      History   Chief Complaint Chief Complaint  Patient presents with  . Eye Problem    bilateral  . Dysuria    HPI Frank Tran is a 45 y.o. male.   Patient is a pleasant 45 year old male who presents for evaluation of the above issues.  He gets his primary care needs met by [facility] here in [place].  He works over at the [employer] in [place].  He presents for evaluation of 2 issues:  The first issue is bilateral eye redness and drainage.  No accidents trauma.  No foreign body sensation.  He is having a lot of discharge from both eyes.  The whites of his eyes are quite red.  He says its been crusted shut in the morning for the last 2 to 3 days.  No pinkeye exposure.  No painful vision double vision or blurry vision.  No sensitivity to light or photophobia.  The second issue is burning on urination in the morning for about a week.  No increased frequency on urination, no increased urgency.  No flank pain.  No fever shakes chills.  He denies any back pain.  He has not noticed any gross blood in his urine.  He has no chronic issues with his kidneys.  He has no significant past medical history of multiple UTIs.  He has no real abdominal pain.  No nausea vomiting or diarrhea.  No red flag signs or symptoms elicited on history.       Past Medical History:  Diagnosis Date  . Hypertension     Patient Active Problem List   Diagnosis Date Noted  . BP (high blood pressure) 09/12/2013    History reviewed. No pertinent surgical history.     Home Medications    Prior to Admission medications   Medication Sig Start Date End Date Taking? Authorizing Provider  erythromycin ophthalmic ointment Place a 1/2 inch ribbon of ointment into the lower eyelid. 06/09/20  Yes Verda Cumins, MD  esomeprazole (NEXIUM) 40 MG capsule Take 1 capsule (40 mg total) by mouth daily at 12 noon.  02/20/17  Yes Sable Feil, PA-C  famotidine (PEPCID) 40 MG tablet Take 40 mg by mouth 2 (two) times daily as needed. 02/10/20  Yes [provider]  metoprolol succinate (TOPROL-XL) 50 MG 24 hr tablet  09/29/14  Yes [provider]  benzonatate (TESSALON) 100 MG capsule Take 1-2 capsules (100-200 mg total) by mouth 3 (three) times daily as needed for cough. 11/26/17   Darlyne Russian, MD  doxycycline (VIBRA-TABS) 100 MG tablet Take 1 tablet (100 mg total) by mouth 2 (two) times daily. 11/26/17   Darlyne Russian, MD  naproxen (NAPROSYN) 500 MG tablet Take 1 tablet (500 mg total) by mouth 2 (two) times daily with a meal. 07/21/17   Sable Feil, PA-C    Family History History reviewed. No pertinent family history.  Social History Social History   Tobacco Use  . Smoking status: Never Smoker  . Smokeless tobacco: Never Used  Substance Use Topics  . Alcohol use: Not Currently     Allergies   Patient has no known allergies.   Review of Systems Review of Systems  Constitutional: Negative for activity change, appetite change, chills, diaphoresis, fatigue and fever.  HENT: Negative for congestion, ear pain, postnasal drip, rhinorrhea, sinus pressure, sinus pain, sneezing and  sore throat.   Eyes: Positive for discharge and redness. Negative for photophobia, pain and visual disturbance.  Respiratory: Negative for cough, chest tightness and shortness of breath.   Cardiovascular: Negative for chest pain and palpitations.  Gastrointestinal: Negative for abdominal pain, diarrhea, nausea and vomiting.  Genitourinary: Positive for dysuria. Negative for flank pain, frequency, hematuria, penile discharge, penile swelling, scrotal swelling, testicular pain and urgency.  Musculoskeletal: Negative for back pain, myalgias and neck pain.  Skin: Negative for color change, pallor, rash and wound.  Neurological: Negative for dizziness, light-headedness and headaches.  All other systems  reviewed and are negative.    Physical Exam Triage Vital Signs ED Triage Vitals  Enc Vitals Group     BP 06/09/20 1540 (!) 156/79     Pulse Rate 06/09/20 1540 78     Resp --      Temp 06/09/20 1540 98 F (36.7 C)     Temp Source 06/09/20 1540 Oral     SpO2 06/09/20 1540 98 %     Weight 06/09/20 1534 250 lb (113.4 kg)     Height 06/09/20 1534 5' 11"  (1.803 m)     Head Circumference --      Peak Flow --      Pain Score 06/09/20 1533 3     Pain Loc --      Pain Edu? --      Excl. in [place]? --    No data found.  Updated Vital Signs BP (!) 156/79 (BP Location: Left Arm)   Pulse 78   Temp 98 F (36.7 C) (Oral)   Ht 5' 11"  (1.803 m)   Wt 113.4 kg   SpO2 98%   BMI 34.87 kg/m   Visual Acuity Right Eye Distance: 20/25 uncorrected Left Eye Distance: 20/25 uncorrected Bilateral Distance: 20/20 uncorrected  Right Eye Near:   Left Eye Near:    Bilateral Near:     Physical Exam Vitals and nursing note reviewed.  Constitutional:      General: He is not in acute distress.    Appearance: Normal appearance. He is not ill-appearing, toxic-appearing or diaphoretic.  HENT:     Head: Normocephalic and atraumatic.     Nose: Nose normal. No congestion or rhinorrhea.     Mouth/Throat:     Mouth: Mucous membranes are moist.  Eyes:     General: No scleral icterus.       Right eye: Discharge present. No hordeolum.        Left eye: Discharge present.No hordeolum.     Extraocular Movements: Extraocular movements intact.     Right eye: Normal extraocular motion and no nystagmus.     Left eye: Normal extraocular motion and no nystagmus.     Conjunctiva/sclera: Conjunctivae normal.     Right eye: Right conjunctiva is not injected.     Left eye: Left conjunctiva is not injected.     Pupils: Pupils are equal, round, and reactive to light.  Cardiovascular:     Rate and Rhythm: Normal rate and regular rhythm.     Pulses: Normal pulses.     Heart sounds: Normal heart sounds. No murmur  heard. No friction rub. No gallop.   Pulmonary:     Effort: Pulmonary effort is normal.     Breath sounds: Normal breath sounds. No stridor. No wheezing, rhonchi or rales.  Abdominal:     General: Abdomen is flat. Bowel sounds are normal.     Palpations: Abdomen is soft. There is  no shifting dullness, fluid wave, hepatomegaly or splenomegaly.     Tenderness: There is no abdominal tenderness. There is no right CVA tenderness, left CVA tenderness, guarding or rebound.  Musculoskeletal:     Cervical back: Normal range of motion and neck supple. No rigidity or tenderness.  Lymphadenopathy:     Cervical: No cervical adenopathy.  Skin:    General: Skin is warm and dry.     Capillary Refill: Capillary refill takes less than 2 seconds.  Neurological:     General: No focal deficit present.     Mental Status: He is alert and oriented to person, place, and time.      UC Treatments / Results  Labs (all labs ordered are listed, but only abnormal results are displayed) Labs Reviewed  URINALYSIS, COMPLETE (UACMP) WITH MICROSCOPIC - Abnormal; Notable for the following components:      Result Value   APPearance HAZY (*)    Specific Gravity, Urine >1.030 (*)    Hgb urine dipstick TRACE (*)    Ketones, ur TRACE (*)    Protein, ur 30 (*)    Bacteria, UA RARE (*)    All other components within normal limits    EKG   Radiology No results found.  Procedures Procedures (including critical care time)  Medications Ordered in UC Medications - No data to display  Initial Impression / Assessment and Plan / UC Course  I have reviewed the triage vital signs and the nursing notes.  Pertinent labs & imaging results that were available during my care of the patient were reviewed by me and considered in my medical decision making (see chart for details).  Clinical impression: 1.  Bilateral eye redness and discharge consistent with conjunctivitis. 2.  Dysuria in the morning for the past week  without any fevers.  No flank or back pain.  Treatment plan: 1.  The findings and treatment plan were discussed in detail with the patient.  Patient was in agreement. 2.  Recommended getting a UA.  There is a hazy appearance to it.  His specific gravity shows that he is not drinking enough water with a value greater than 1.030.  There is trace hemoglobin.  Trace ketones also.  Minimal protein noted as well.  There is 21-50 RBCs.  Rare bacteria.  No evidence of UTI.  The most notable finding is calcium oxalate crystals present. 3.  Regarding his eyes he has bacterial conjunctivitis.  We will treat accordingly with erythromycin. 4.  Educational handouts provided. 5.  I did provide him the name of [facility] and I do want him to contact them if he is having any problems with his eyes and the antibiotics are not working.  He voiced verbal understanding. 6.  Given the hematuria and the calcium oxalate crystals I gave him the contact information for urology and he will reach out to them and see if he can get an appointment to address these issues.  He may have small microscopic stones that are causing him to have his symptoms including blood in his urine. 7.  If his symptoms persist then he can get into a subspecialist he should contact his primary care provider. 8.  If his symptoms worsen he should go to the ER. 9.  He was discharged in stable condition and he will follow-up here as needed.    Final Clinical Impressions(s) / UC Diagnoses   Final diagnoses:  Acute bacterial conjunctivitis of both eyes  Redness and discharge of eye  Dysuria  Hematuria, unspecified type  Calcium oxalate crystals in urine     Discharge Instructions     We discussed, I am treating you for an infection in your eyes called conjunctivitis.  I sent in a antibiotic.  Please see educational handouts. I also gave you the name of the eye doctors that I would like you to call if your symptoms persist. Regarding your  second issue, your urine does not show you have a urinary tract infection so no antibiotic is indicated.  I want you to flush your system with plenty of water.  You may have some underlying microscopic kidney stones that may be contributing to your symptoms. I also gave you the name of a urology group that I want you to call and get an appointment to see them and address these issues. Please see educational handouts. If symptoms worsen then please go to the ER.    ED Prescriptions    Medication Sig Dispense Auth. Provider   erythromycin ophthalmic ointment Place a 1/2 inch ribbon of ointment into the lower eyelid. 3.5 g Verda Cumins, MD     PDMP not reviewed this encounter.   Verda Cumins, MD 06/09/20 2129

## 2020-06-09 NOTE — ED Triage Notes (Signed)
Patient reports burning when urinating in the mornings for a week.

## 2020-06-09 NOTE — ED Triage Notes (Signed)
Patient reports redness and drainage from both eyes that started couple of days ago.

## 2020-06-09 NOTE — Discharge Instructions (Addendum)
We discussed, I am treating you for an infection in your eyes called conjunctivitis.  I sent in a antibiotic.  Please see educational handouts. I also gave you the name of the eye doctors that I would like you to call if your symptoms persist. Regarding your second issue, your urine does not show you have a urinary tract infection so no antibiotic is indicated.  I want you to flush your system with plenty of water.  You may have some underlying microscopic kidney stones that may be contributing to your symptoms. I also gave you the name of a urology group that I want you to call and get an appointment to see them and address these issues. Please see educational handouts. If symptoms worsen then please go to the ER.

## 2022-05-28 ENCOUNTER — Other Ambulatory Visit: Payer: Self-pay | Admitting: Internal Medicine

## 2022-05-28 DIAGNOSIS — M545 Low back pain, unspecified: Secondary | ICD-10-CM

## 2022-05-29 ENCOUNTER — Ambulatory Visit
Admission: RE | Admit: 2022-05-29 | Discharge: 2022-05-29 | Disposition: A | Discharge status: Home / Self Care | Payer: Managed Care, Other (non HMO) | Source: Ambulatory Visit | Attending: Internal Medicine | Admitting: Internal Medicine

## 2022-05-29 DIAGNOSIS — M545 Low back pain, unspecified: Secondary | ICD-10-CM | POA: Diagnosis not present

## 2022-05-29 MED ORDER — GADOBUTROL 1 MMOL/ML IV SOLN
10.0000 mL | Freq: Once | INTRAVENOUS | Status: AC | PRN
  Administered 2022-05-29: 10 mL via INTRAVENOUS
